# Patient Record
Sex: Male | Born: 1990 | Race: Black or African American | Hispanic: No | Marital: Single | Smoking: Current every day smoker
Health system: Southern US, Community
[De-identification: ages and names within clinical notes are randomized; demographics above are authoritative.]

---
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
## 2014-08-31 ENCOUNTER — Emergency Department (HOSPITAL_COMMUNITY): Payer: Self-pay

## 2014-08-31 ENCOUNTER — Emergency Department (HOSPITAL_COMMUNITY)
Admission: EM | Admit: 2014-08-31 | Discharge: 2014-08-31 | Disposition: A | Discharge status: Home / Self Care | Payer: Worker's Compensation | Attending: Emergency Medicine | Admitting: Emergency Medicine

## 2014-08-31 ENCOUNTER — Encounter (HOSPITAL_COMMUNITY): Payer: Self-pay | Admitting: *Deleted

## 2014-08-31 DIAGNOSIS — Y998 Other external cause status: Secondary | ICD-10-CM | POA: Insufficient documentation

## 2014-08-31 DIAGNOSIS — S6991XA Unspecified injury of right wrist, hand and finger(s), initial encounter: Secondary | ICD-10-CM | POA: Insufficient documentation

## 2014-08-31 DIAGNOSIS — Y929 Unspecified place or not applicable: Secondary | ICD-10-CM | POA: Insufficient documentation

## 2014-08-31 DIAGNOSIS — W230XXA Caught, crushed, jammed, or pinched between moving objects, initial encounter: Secondary | ICD-10-CM | POA: Insufficient documentation

## 2014-08-31 DIAGNOSIS — Z720 Tobacco use: Secondary | ICD-10-CM | POA: Insufficient documentation

## 2014-08-31 DIAGNOSIS — Y9389 Activity, other specified: Secondary | ICD-10-CM | POA: Insufficient documentation

## 2014-08-31 MED ORDER — TETANUS-DIPHTH-ACELL PERTUSSIS 5-2.5-18.5 LF-MCG/0.5 IM SUSP
0.5000 mL | Freq: Once | INTRAMUSCULAR | Status: AC
  Administered 2014-08-31: 0.5 mL via INTRAMUSCULAR
  Filled 2014-08-31: qty 0.5

## 2014-08-31 MED ORDER — HYDROCODONE-ACETAMINOPHEN 5-325 MG PO TABS: 1.0000 | ORAL_TABLET | Freq: Four times a day (QID) | ORAL | Status: AC | PRN

## 2014-08-31 NOTE — ED Provider Notes (Signed)
CSN: [id number]     Arrival date & time 08/31/14  2157 History  This chart was scribed for non-physician practitioner working with Rolland PorterMark James, MD by Murriel HopperAlec Clements, ED Scribe. This patient was seen in room TR07C/TR07C and the patient's care was started at 11:04 PM.    Chief Complaint  Patient presents with  . Finger Injury      The history is provided by the patient. No language interpreter was used.     HPI Comments: Earnest RosierJerome X Clements is a 24 y.o. male who presents to the Emergency Department complaining of constant right middle finger pain with associated swelling that has been present since immediately PTA. Pt states that he was changing the rims on a tire when his hand slipped and injured his finger. Pt states that it began to bleed from underneath his nail and notes that it is difficult to move.      History reviewed. No pertinent past medical history. History reviewed. No pertinent past surgical history. No family history on file. History  Substance Use Topics  . Smoking status: Current Every Day Smoker  . Smokeless tobacco: Not on file  . Alcohol Use: No    Review of Systems  Musculoskeletal: Positive for joint swelling and arthralgias.      Allergies  Review of patient's allergies indicates no known allergies.  Home Medications   Prior to Admission medications   Not on File   BP 139/84 mmHg  Pulse 55  Temp(Src) 98 F (36.7 C) (Oral)  Resp 14  SpO2 98% Physical Exam  Constitutional: He is oriented to person, place, and time. He appears well-developed and well-nourished.  HENT:  Head: Normocephalic and atraumatic.  Eyes: Conjunctivae and EOM are normal.  Neck: Normal range of motion.  Cardiovascular: Normal rate.   Pulmonary/Chest: Effort normal.  Abdominal: He exhibits no distension.  Musculoskeletal: Normal range of motion.  Right middle finger moderately swollen and tender to palpation, no bony abnormality or deformity, no laceration, range of motion  strength 5/5  Neurological: He is alert and oriented to person, place, and time.  Skin: Skin is warm and dry.  No subungual hematoma  Psychiatric: He has a normal mood and affect. His behavior is normal. Judgment and thought content normal.  Nursing note and vitals reviewed.   ED Course  Procedures (including critical care time)  DIAGNOSTIC STUDIES: Oxygen Saturation is 98% on RA, normal by my interpretation.    COORDINATION OF CARE: 11:08 PM Discussed treatment plan with pt at bedside and pt agreed to plan.   Labs Review Labs Reviewed - No data to display  Imaging Review Dg Finger Middle Right  08/31/2014   CLINICAL DATA:  Crushing injury to third distal phalanx  EXAM: RIGHT MIDDLE FINGER 2+V  COMPARISON:  None.  FINDINGS: There is no evidence of fracture or dislocation. There is no evidence of arthropathy or other focal bone abnormality. Soft tissues are unremarkable.  IMPRESSION: Negative.   Electronically Signed   By: Ellery Plunkaniel R Mitchell M.D.   On: 08/31/2014 23:06     EKG Interpretation None      MDM   Final diagnoses:  Finger injury, right, initial encounter   Patient with finger injury, after sustaining blunt trauma while removing a car will. Plain films are negative. Tetanus shot is updated. There is no laceration. Will treat pain, and splint the finger. Recommend primary care follow-up. Patient understands and agrees with the plan. He is stable and ready for discharge.  I personally  performed the services described in this documentation, which was scribed in my presence. The recorded information has been reviewed and is accurate.     Roxy Horseman, PA-C 08/31/14 2340  Vanetta Mulders, MD 09/01/14 [phone]

## 2014-08-31 NOTE — ED Notes (Signed)
The pt is c/o pain ib his  Rt middle finger.  His finger was caught on a tire and a rim while working today painful

## 2014-08-31 NOTE — Discharge Instructions (Signed)
Blunt Trauma You have been evaluated for injuries. You have been examined and your caregiver has not found injuries serious enough to require hospitalization. It is common to have multiple bruises and sore muscles following an accident. These tend to feel worse for the first 24 hours. You will feel more stiffness and soreness over the next several hours and worse when you wake up the first morning after your accident. After this point, you should begin to improve with each passing day. The amount of improvement depends on the amount of damage done in the accident. Following your accident, if some part of your body does not work as it should, or if the pain in any area continues to increase, you should return to the Emergency Department for re-evaluation.  HOME CARE INSTRUCTIONS  Routine care for sore areas should include:  Ice to sore areas every 2 hours for 20 minutes while awake for the next 2 days.  Drink extra fluids (not alcohol).  Take a hot or warm shower or bath once or twice a day to increase blood flow to sore muscles. This will help you "limber up".  Activity as tolerated. Lifting may aggravate neck or back pain.  Only take over-the-counter or prescription medicines for pain, discomfort, or fever as directed by your caregiver. Do not use aspirin. This may increase bruising or increase bleeding if there are small areas where this is happening. SEEK IMMEDIATE MEDICAL CARE IF:  Numbness, tingling, weakness, or problem with the use of your arms or legs.  A severe headache is not relieved with medications.  There is a change in bowel or bladder control.  Increasing pain in any areas of the body.  Short of breath or dizzy.  Nauseated, vomiting, or sweating.  Increasing belly (abdominal) discomfort.  Blood in urine, stool, or vomiting blood.  Pain in either shoulder in an area where a shoulder strap would be.  Feelings of lightheadedness or if you have a fainting  episode. Sometimes it is not possible to identify all injuries immediately after the trauma. It is important that you continue to monitor your condition after the emergency department visit. If you feel you are not improving, or improving more slowly than should be expected, call your physician. If you feel your symptoms (problems) are worsening, return to the Emergency Department immediately. Document Released: 02/25/2001 Document Revised: 08/24/2011 Document Reviewed: 01/18/2008 [facility] Patient Information 2015 [employer] [place]. This information is not intended to replace advice given to you by your health care provider. Make sure you discuss any questions you have with your health care provider.  Fingertip Injuries and Amputations Fingertip injuries are common and often get injured because they are last to escape when pulling your hand out of harm's way. You have amputated (cut off) part of your finger. How this turns out depends largely on how much was amputated. If just the tip is amputated, often the end of the finger will grow back and the finger may return to much the same as it was before the injury.  If more of the finger is missing, your caregiver has done the best with the tissue remaining to allow you to keep as much finger as is possible. Your caregiver after checking your injury has tried to leave you with a painless fingertip that has durable, feeling skin. If possible, your caregiver has tried to maintain the finger's length and appearance and preserve its fingernail.  Please read the instructions outlined below and refer to this sheet in the  next few weeks. These instructions provide you with general information on caring for yourself. Your caregiver may also give you specific instructions. While your treatment has been done according to the most current medical practices available, unavoidable complications occasionally occur. If you have any problems or questions after discharge, please  call your caregiver. HOME CARE INSTRUCTIONS   You may resume normal diet and activities as directed or allowed.  Keep your hand elevated above the level of your heart. This helps decrease pain and swelling.  Keep ice packs (or a bag of ice wrapped in a towel) on the injured area for 15-20 minutes, 03-04 times per day, for the first two days.  Change dressings if necessary or as directed.  Clean the wound daily or as directed.  Only take over-the-counter or prescription medicines for pain, discomfort, or fever as directed by your caregiver.  Keep appointments as directed. SEEK IMMEDIATE MEDICAL CARE IF:  You develop redness, swelling, numbness or increasing pain in the wound.  There is pus coming from the wound.  You develop an unexplained oral temperature above 102 F (38.9 C) or as your caregiver suggests.  There is a foul (bad) smell coming from the wound or dressing.  There is a breaking open of the wound (edges not staying together) after sutures or staples have been removed. MAKE SURE YOU:   Understand these instructions.  Will watch your condition.  Will get help right away if you are not doing well or get worse. Document Released: 04/22/2005 Document Revised: 08/24/2011 Document Reviewed: 03/21/2008 [facility] Patient Information 2015 [place], [place]. This information is not intended to replace advice given to you by your health care provider. Make sure you discuss any questions you have with your health care provider.

## 2015-03-19 ENCOUNTER — Encounter (HOSPITAL_COMMUNITY): Payer: Self-pay | Admitting: Emergency Medicine

## 2015-03-19 ENCOUNTER — Emergency Department (HOSPITAL_COMMUNITY)
Admission: EM | Admit: 2015-03-19 | Discharge: 2015-03-19 | Disposition: A | Discharge status: Home / Self Care | Payer: Self-pay | Attending: Emergency Medicine | Admitting: Emergency Medicine

## 2015-03-19 DIAGNOSIS — Y998 Other external cause status: Secondary | ICD-10-CM | POA: Insufficient documentation

## 2015-03-19 DIAGNOSIS — Y9389 Activity, other specified: Secondary | ICD-10-CM | POA: Insufficient documentation

## 2015-03-19 DIAGNOSIS — S0502XA Injury of conjunctiva and corneal abrasion without foreign body, left eye, initial encounter: Secondary | ICD-10-CM | POA: Insufficient documentation

## 2015-03-19 DIAGNOSIS — Y9289 Other specified places as the place of occurrence of the external cause: Secondary | ICD-10-CM | POA: Insufficient documentation

## 2015-03-19 DIAGNOSIS — H5712 Ocular pain, left eye: Secondary | ICD-10-CM

## 2015-03-19 DIAGNOSIS — Z720 Tobacco use: Secondary | ICD-10-CM | POA: Insufficient documentation

## 2015-03-19 MED ORDER — TETRACAINE HCL 0.5 % OP SOLN
1.0000 [drp] | Freq: Once | OPHTHALMIC | Status: AC
  Administered 2015-03-19: 1 [drp] via OPHTHALMIC
  Filled 2015-03-19 (×2): qty 2

## 2015-03-19 MED ORDER — IBUPROFEN 800 MG PO TABS: 800.0000 mg | ORAL_TABLET | Freq: Three times a day (TID) | ORAL | Status: AC

## 2015-03-19 MED ORDER — FLUORESCEIN SODIUM 1 MG OP STRP
1.0000 | ORAL_STRIP | Freq: Once | OPHTHALMIC | Status: AC
  Administered 2015-03-19: 1 via OPHTHALMIC
  Filled 2015-03-19: qty 1

## 2015-03-19 MED ORDER — POLYMYXIN B-TRIMETHOPRIM 10000-0.1 UNIT/ML-% OP SOLN: 1.0000 [drp] | OPHTHALMIC | Status: AC

## 2015-03-19 NOTE — Discharge Instructions (Signed)
1. Medications: polytrim eye drops, ibuprofen, usual home medications 2. Treatment: rest, drink plenty of fluids  3. Follow Up: please followup with ophthalmology in 2-3 days and your primary doctor in the next week for discussion of your diagnoses and further evaluation after today's visit; if you do not have a primary care doctor use the resource guide provided to find one; please return to the ER for vision changes, severe pain, new or worsening symptoms    Corneal Abrasion The cornea is the clear covering at the front and center of the eye. When looking at the colored portion of the eye (iris), you are looking through the cornea. This very thin tissue is made up of many layers. The surface layer is a single layer of cells (corneal epithelium) and is one of the most sensitive tissues in the body. If a scratch or injury causes the corneal epithelium to come off, it is called a corneal abrasion. If the injury extends to the tissues below the epithelium, the condition is called a corneal ulcer. CAUSES   Scratches.  Trauma.  Foreign body in the eye. Some people have recurrences of abrasions in the area of the original injury even after it has healed (recurrent erosion syndrome). Recurrent erosion syndrome generally improves and goes away with time. SYMPTOMS   Eye pain.  Difficulty or inability to keep the injured eye open.  The eye becomes very sensitive to light.  Recurrent erosions tend to happen suddenly, first thing in the morning, usually after waking up and opening the eye. DIAGNOSIS  Your health care provider can diagnose a corneal abrasion during an eye exam. Dye is usually placed in the eye using a drop or a small paper strip moistened by your tears. When the eye is examined with a special light, the abrasion shows up clearly because of the dye. TREATMENT   Small abrasions may be treated with antibiotic drops or ointment alone.  A pressure patch may be put over the eye. If this  is done, follow your doctor's instructions for when to remove the patch. Do not drive or use machines while the eye patch is on. Judging distances is hard to do with a patch on. If the abrasion becomes infected and spreads to the deeper tissues of the cornea, a corneal ulcer can result. This is serious because it can cause corneal scarring. Corneal scars interfere with light passing through the cornea and cause a loss of vision in the involved eye. HOME CARE INSTRUCTIONS  Use medicine or ointment as directed. Only take over-the-counter or prescription medicines for pain, discomfort, or fever as directed by your health care provider.  Do not drive or operate machinery if your eye is patched. Your ability to judge distances is impaired.  If your health care provider has given you a follow-up appointment, it is very important to keep that appointment. Not keeping the appointment could result in a severe eye infection or permanent loss of vision. If there is any problem keeping the appointment, let your health care provider know. SEEK MEDICAL CARE IF:   You have pain, light sensitivity, and a scratchy feeling in one eye or both eyes.  Your pressure patch keeps loosening up, and you can blink your eye under the patch after treatment.  Any kind of discharge develops from the eye after treatment or if the lids stick together in the morning.  You have the same symptoms in the morning as you did with the original abrasion days, weeks, or  months after the abrasion healed. MAKE SURE YOU:   Understand these instructions.  Will watch your condition.  Will get help right away if you are not doing well or get worse. Document Released: 05/29/2000 Document Revised: 06/06/2013 Document Reviewed: 02/06/2013 [facility] Patient Information 2015 [place], [place]. This information is not intended to replace advice given to you by your health care provider. Make sure you discuss any questions you have with your  health care provider.   Emergency Department Resource Guide 1) Find a Doctor and Pay Out of Pocket Although you won't have to find out who is covered by your insurance plan, it is a good idea to ask around and get recommendations. You will then need to call the office and see if the doctor you have chosen will accept you as a new patient and what types of options they offer for patients who are self-pay. Some doctors offer discounts or will set up payment plans for their patients who do not have insurance, but you will need to ask so you aren't surprised when you get to your appointment.  2) Contact Your Local Health Department Not all health departments have doctors that can see patients for sick visits, but many do, so it is worth a call to see if yours does. If you don't know where your local health department is, you can check in your phone book. The CDC also has a tool to help you locate your state's health department, and many state websites also have listings of all of their local health departments.  3) Find a Walk-in Clinic If your illness is not likely to be very severe or complicated, you may want to try a walk in clinic. These are popping up all over the country in pharmacies, drugstores, and shopping centers. They're usually staffed by nurse practitioners or physician assistants that have been trained to treat common illnesses and complaints. They're usually fairly quick and inexpensive. However, if you have serious medical issues or chronic medical problems, these are probably not your best option.  No Primary Care Doctor: - Call Health Connect at  [phone] - they can help you locate a primary care doctor that  accepts your insurance, provides certain services, etc. - Physician Referral Service- [phone]  Chronic Pain Problems: Organization         Address  Phone   Notes  [facility]  [phone] Patients need to be referred by their primary care doctor.    Medication Assistance: Organization         Address  Phone   Notes  [facility] Medication [facility] [address] [place] [place], [place] [address] [phone] --Must be a resident of [facility] -- Must have NO insurance coverage whatsoever (no Medicaid/ Medicare, etc.) -- The pt. MUST have a primary care doctor that directs their care regularly and follows them in the community   [facility]  [phone]   [employer]  [phone]    Agencies that provide inexpensive medical care: Organization         Address  Phone   Notes  [facility]  [phone]   [facility]    [phone]   [facility] [address] [place], [place] [address] [phone]   [facility] [address] [place] [place] [phone]   Planned Parenthood    [phone]   [facility]    (  [phone]   [facility]  [address], [place] Phone:  [phone], Fax:  [phone] Hours of Operation:  9 am - 6 pm, M-F.  Also accepts Medicaid/Medicare and self-pay.  [facility]  [address], [place] Phone: [phone], Fax: [phone]. Hours of Operation:  8:30 am - 5:30 pm, M-F.  Also accepts Medicaid and self-pay.  [facility] [place] [address] [place] Phone: [phone]   [facility] [address] Natasha Bence [place], [place] [phone], Ext. [phone] Mondays & Thursdays: 7-9 AM.  First 15 patients are seen on a first come, first serve basis.    Medicaid-accepting [facility] Providers:  Organization         Address  Phone   Notes  [facility] [address], [place] [phone] Also accepts self-pay patients.  [facility] [address] Laurell Josephs [place], [place]  [phone]   [facility] [address], [place] [phone]   [facility] [address], [place] [phone]   Renaye Rakers [address], [place]   [phone] Only accepts [place] patients after they have their name applied to their card.   Self-Pay (no insurance) in [facility]:  Organization         Address  Phone   Notes  Sickle Cell Patients, [facility] [address] [place], [place] [phone]   [facility] [address] [place], [place] [phone]   [facility]  [address], [place] [phone]   [facility]/Dr. Osei-Bonsu  [address] [place] or [address], [place] [phone] Phone number for both [place] and [place] locations is the same.  [facility] [address] [place] [phone]   [facility] [address] [place] or [address] [phone]   [facility] [address] [place] [phone], phone; [phone], fax Sees patients 1st and 3rd Saturday of every month.  Must not qualify for public or private insurance (i.e. Medicaid, Medicare, [place] Health Choice, Veterans' Benefits)  Household income should be no more than 200% of the poverty level The clinic cannot treat you if you are pregnant or think you are pregnant  Sexually transmitted diseases are not treated at the clinic.    Dental Care: Organization         Address  Phone  Notes  [facility] Department of [facility] [address] [place], [place] [phone] Accepts children up to age 67 who are enrolled in [place] or [place] Health Choice; pregnant women with a Medicaid card; and children who have applied for Medicaid or [place] Health Choice, but were declined, whose parents can pay a reduced fee at time of service.    [facility] Department of [facility]  [address], [place] [phone] Accepts children up to age 14 who are enrolled in [place] or [place] Health Choice; pregnant women with a Medicaid card; and children who have applied for Medicaid or [place] Health Choice, but were declined, whose parents can pay a reduced fee at time of service.  [facility]  [address] [place], [place] [phone] Patients are seen by appointment only. Walk-ins are not accepted. [facility] will see patients 43 years of age and older. Monday - Tuesday (8am-5pm) Most Wednesdays (8:30-5pm) $30 per visit, cash only  [facility]  [address], [facility] [phone] Patients are seen by appointment only. Walk-ins are not accepted. [facility] will see patients 61 years of age and older. One Wednesday Evening (Monthly: Volunteer Based).  $30 per visit, cash only  [employer]  [phone] for adults; Children under age 64, call Graduate Pediatric Dentistry at [phone]. Children aged 44-14, please call [phone] to request a pediatric application.  Dental services are provided in all areas of dental care including fillings, crowns and bridges, complete and partial dentures, implants, gum treatment, root canals, and extractions. Preventive care is also provided. Treatment is provided to both adults and children. Patients are selected via a lottery and there is often a waiting list.   [facility] [address] [place]  [phone] [email or web address]   [facility] [address] [place], [place] [phone], Ext. [phone] Second and Fourth Thursday of each month, opens at 6:30 AM; Clinic ends at 9 AM.  Patients are seen on a first-come first-served basis, and a limited number are seen during each clinic.   [facility]  [address] Ether Griffins [place], [place] [phone]   Eligibility Requirements You must have lived in [place], [place], or [place] counties for at least the last three months.   You cannot be eligible for state or federal sponsored [place], including [employer], [place], or [employer].   You generally cannot be eligible for healthcare insurance through your employer.    How to apply: Eligibility screenings are held every Tuesday and Wednesday afternoon from 1:00 pm until 4:00 pm. You do not need an appointment for the interview!  [facility] [address] [place], [place] [phone]   [facility]  [phone]   [facility] Health Department  [phone]   [facility] Health Department  [phone]    Behavioral Health Resources in the Community: Intensive Outpatient Programs Organization         Address  Phone  Notes  [facility] [address], [place], [place] [phone]   [facility] [address] [place], [place] [phone]   [facility] [address] [place], [place]  [phone]   [facility] [address]  [place], [place] [phone] or [phone]   Substance Abuse Resources Organization         Address  Phone  Notes  [facility] Drug [facility]  [phone]   [facility]  [phone]   [facility]  [phone]   Floydene Flock  [phone]   Residential & Outpatient Substance Abuse Program  [phone]   Psychological Services Organization         Address  Phone  Notes  [facility] Behavioral Health  [phone]   [facility]  [phone]   [facility] [address] [place] [phone] or [phone]    Mobile Crisis Teams Organization         Address  Phone  Notes  [facility], Mobile Crisis Care Unit  [phone]   [facility]  [address]  [place], [place] [phone]   Doristine LocksSharon DeEsch [address] [place] [phone]    Self-Help/Support Groups Organization         Address  Phone             Notes  [facility] [place] - variety of support groups  336- [phone] Call for more information  Narcotics Anonymous (NA), [facility] [address], [place]  2 meetings at this location   Statisticianesidential Treatment Programs Organization         Address  Phone  Notes  [facility] [address],    [place]  [phone]   [facility]  [address], [place] [phone], [place], [place] [phone]   [facility] [address], [place] [phone] Admissions: 8am-3pm M-F  [facility] [address]    [place], [place] [phone]   [facility] [address], [place], [place] [phone]   [facility] [address]  [place], [place] [phone]   [facility] - Intensive Outpatient [address], [place], [place] [phone]   [facility] ([facility] [address]  [place], [place] [phone] or [phone]   [facility] ([facility]) [address] [place], [place] [phone] Accepts Medicaid  [facility] [address]  [place] [phone] Substance Abuse/Addiction Treatment   [facility] Behavioral Health Resources Organization         Address  Phone  Notes  [facility]  [phone]) [phone]   Angie FavaJulie Brannon, PhD [address] [place], [place]   [phone]) [phone] or [phone](336) [phone]   [facility]   [address] [place], [place] [phone]) [phone]   [facility] [address], [place], [place] [phone]) [phone] Insurance/Medicaid/sponsorship through [facility]  [facility] [address]                                    [place], [place] [phone]) [phone]  Therapy/tele-psych/case  [facility] [address]   [place], [place] [phone]    Dr. Lolly MustacheArfeen  [phone]   [facility] [place]  [employer] [facility] [place] [facility] 1) [address], [place] 2) [address], [place] 3)  [address], [place] [phone]) [phone]) [phone]  [phone]) [phone]   [facility] Child Abuse Hotline [phone](336) [phone] or [phone](336) [phone] (After Hours)
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
## 2020-01-23 ENCOUNTER — Emergency Department (HOSPITAL_COMMUNITY)
Admission: EM | Admit: 2020-01-23 | Discharge: 2020-01-23 | Disposition: A | Discharge status: Home / Self Care | Payer: Self-pay | Attending: Emergency Medicine | Admitting: Emergency Medicine

## 2020-01-23 ENCOUNTER — Other Ambulatory Visit: Payer: Self-pay

## 2020-01-23 ENCOUNTER — Emergency Department (HOSPITAL_COMMUNITY): Payer: Self-pay

## 2020-01-23 ENCOUNTER — Encounter (HOSPITAL_COMMUNITY): Payer: Self-pay | Admitting: Emergency Medicine

## 2020-01-23 DIAGNOSIS — W312XXA Contact with powered woodworking and forming machines, initial encounter: Secondary | ICD-10-CM | POA: Insufficient documentation

## 2020-01-23 DIAGNOSIS — Z23 Encounter for immunization: Secondary | ICD-10-CM | POA: Insufficient documentation

## 2020-01-23 DIAGNOSIS — Y999 Unspecified external cause status: Secondary | ICD-10-CM | POA: Insufficient documentation

## 2020-01-23 DIAGNOSIS — S61219A Laceration without foreign body of unspecified finger without damage to nail, initial encounter: Secondary | ICD-10-CM

## 2020-01-23 DIAGNOSIS — S61212A Laceration without foreign body of right middle finger without damage to nail, initial encounter: Secondary | ICD-10-CM | POA: Insufficient documentation

## 2020-01-23 DIAGNOSIS — Y9389 Activity, other specified: Secondary | ICD-10-CM | POA: Insufficient documentation

## 2020-01-23 DIAGNOSIS — F17200 Nicotine dependence, unspecified, uncomplicated: Secondary | ICD-10-CM | POA: Insufficient documentation

## 2020-01-23 DIAGNOSIS — Y929 Unspecified place or not applicable: Secondary | ICD-10-CM | POA: Insufficient documentation

## 2020-01-23 DIAGNOSIS — S61210A Laceration without foreign body of right index finger without damage to nail, initial encounter: Secondary | ICD-10-CM | POA: Insufficient documentation

## 2020-01-23 MED ORDER — TETANUS-DIPHTH-ACELL PERTUSSIS 5-2.5-18.5 LF-MCG/0.5 IM SUSP
0.5000 mL | Freq: Once | INTRAMUSCULAR | Status: AC
  Administered 2020-01-23: 0.5 mL via INTRAMUSCULAR
  Filled 2020-01-23: qty 0.5

## 2020-01-23 MED ORDER — LIDOCAINE HCL (PF) 1 % IJ SOLN
5.0000 mL | Freq: Once | INTRAMUSCULAR | Status: AC
  Administered 2020-01-23: 5 mL
  Filled 2020-01-23: qty 30

## 2020-01-23 MED ORDER — KETOROLAC TROMETHAMINE 60 MG/2ML IM SOLN
60.0000 mg | Freq: Once | INTRAMUSCULAR | Status: AC
  Administered 2020-01-23: 60 mg via INTRAMUSCULAR
  Filled 2020-01-23: qty 2

## 2020-01-23 NOTE — ED Provider Notes (Signed)
[facility]-EMERGENCY DEPT Provider Note   CSN: [id number] Arrival date & time: 01/23/20  1726     History Chief Complaint  Patient presents with  . Finger Injury    Kurt Clements is a 29 y.o. male.  HPI 29 year old male with no significant medical history presents to the ER with a laceration to his right hand.  Patient states he was working with a wood saw when his hand slipped and went under the side.  He presented with lacerations to his index, middle, and ring finger on his right hand.  Denies any numbness or tingling.  Endorses mild to moderate pain.  Patient was able to make the bleeding stopped in the waiting room.  Unaware of last tetanus      History reviewed. No pertinent past medical history.  There are no problems to display for this patient.   History reviewed. No pertinent surgical history.     No family history on file.  Social History   Tobacco Use  . Smoking status: Current Every Day Smoker  Substance Use Topics  . Alcohol use: No  . Drug use: Not on file    Home Medications Prior to Admission medications   Medication Sig Start Date End Date Taking? Authorizing Provider  HYDROcodone-acetaminophen (NORCO/VICODIN) 5-325 MG per tablet Take 1-2 tablets by mouth every 6 (six) hours as needed. 08/31/14   Roxy Horseman, PA-C  ibuprofen (ADVIL,MOTRIN) 800 MG tablet Take 1 tablet (800 mg total) by mouth 3 (three) times daily. 03/19/15   Mady Gemma, PA-C  trimethoprim-polymyxin b (POLYTRIM) ophthalmic solution Place 1 drop into the left eye every 4 (four) hours. 03/19/15   Mady Gemma, PA-C    Allergies    Patient has no known allergies.  Review of Systems   Review of Systems  Constitutional: Negative for chills and fever.  Skin: Positive for wound.  Neurological: Negative for weakness and numbness.    Physical Exam Updated Vital Signs BP (!) 187/96   Pulse 82   Temp 98.9 F (37.2 C)   Resp 16   Ht 6'  7" (2.007 m)   Wt 108.9 kg   SpO2 98%   BMI 27.04 kg/m   Physical Exam Vitals reviewed.  Constitutional:      General: He is not in acute distress.    Appearance: Normal appearance. He is not ill-appearing or diaphoretic.  HENT:     Head: Normocephalic and atraumatic.  Eyes:     General:        Right eye: No discharge.        Left eye: No discharge.     Extraocular Movements: Extraocular movements intact.     Conjunctiva/sclera: Conjunctivae normal.  Musculoskeletal:        General: Signs of injury present. No swelling. Normal range of motion.     Comments: 1 cm x 1 mm lacerations to the right index, middle and ring finger. Middle finger with evidence of missing soft tissue and partial nail damage. Not actively bleeding. 5/5 strength in each finger, including grip strength. Sensations intact. 2+ radial pulse. Able to move all 4 fingers without difficulty.   Skin:    Findings: Lesion present. No erythema or rash.  Neurological:     General: No focal deficit present.     Mental Status: He is alert and oriented to person, place, and time.     Sensory: No sensory deficit.     Motor: No weakness.  Psychiatric:  Mood and Affect: Mood normal.        Behavior: Behavior normal.         ED Results / Procedures / Treatments   Labs (all labs ordered are listed, but only abnormal results are displayed) Labs Reviewed - No data to display  EKG None  Radiology DG Hand Complete Right  Result Date: 01/23/2020 CLINICAL DATA:  Patient got his right index and middle fingers caught in a wood saw. Bleeding and open areas noted to both fingers. Able to move fingers, good capillary refill. EXAM: RIGHT HAND - COMPLETE 3+ VIEW COMPARISON:  None. FINDINGS: No fracture.  No bone lesion. Joints normally spaced and aligned. Soft tissue injury noted to tips the index and middle fingers and more subtly to the tip of the fourth finger. Small amount of avulse soft tissue from the tip of the  middle finger. No radiopaque foreign body. IMPRESSION: 1. No fracture or dislocation.  No radiopaque foreign body. 2. Soft tissue injuries to the finger tips of the index middle and ring fingers. Electronically Signed   By: Amie Portland M.D.   On: 01/23/2020 18:40    Procedures .Marland KitchenLaceration Repair  Date/Time: 01/23/2020 8:40 PM Performed by: Mare Ferrari, PA-C Authorized by: Mare Ferrari, PA-C   Consent:    Consent obtained:  Verbal   Consent given by:  Patient   Risks discussed:  Infection, need for additional repair, pain, poor cosmetic result and poor wound healing   Alternatives discussed:  No treatment and delayed treatment Universal protocol:    Procedure explained and questions answered to patient or proxy's satisfaction: yes     Relevant documents present and verified: yes     Test results available and properly labeled: yes     Imaging studies available: yes     Required blood products, implants, devices, and special equipment available: yes     Site/side marked: yes     Immediately prior to procedure, a time out was called: yes     Patient identity confirmed:  Verbally with patient Anesthesia (see MAR for exact dosages):    Anesthesia method:  Local infiltration   Local anesthetic:  Lidocaine 1% w/o epi Laceration details:    Location:  Finger   Finger location:  R index finger   Length (cm):  1   Depth (mm):  1 Repair type:    Repair type:  Simple Pre-procedure details:    Preparation:  Imaging obtained to evaluate for foreign bodies and patient was prepped and draped in usual sterile fashion Exploration:    Hemostasis achieved with:  Direct pressure   Wound exploration: wound explored through full range of motion and entire depth of wound probed and visualized     Wound extent: no foreign bodies/material noted, no muscle damage noted, no nerve damage noted, no tendon damage noted and no underlying fracture noted   Treatment:    Area cleansed with:  Betadine  and saline   Amount of cleaning:  Extensive   Irrigation solution:  Sterile saline   Irrigation volume:  50cc   Visualized foreign bodies/material removed: no   Skin repair:    Repair method:  Sutures   Suture material:  Prolene   Suture technique:  Simple interrupted   Number of sutures:  2 Approximation:    Approximation:  Loose Post-procedure details:    Dressing:  Non-adherent dressing   Patient tolerance of procedure:  Tolerated well, no immediate complications   (including critical care time)  Medications Ordered in ED Medications  lidocaine (PF) (XYLOCAINE) 1 % injection 5 mL (has no administration in time range)  ketorolac (TORADOL) injection 60 mg (60 mg Intramuscular Given 01/23/20 1853)  Tdap (BOOSTRIX) injection 0.5 mL (0.5 mLs Intramuscular Given 01/23/20 1853)    ED Course  I have reviewed the triage vital signs and the nursing notes.  Pertinent labs & imaging results that were available during my care of the patient were reviewed by me and considered in my medical decision making (see chart for details).    MDM Rules/Calculators/A&P                          29 year old male with finger lacerations from a one-time on the right Pressure irrigation performed. Wound explored and base of wound visualized in a bloodless field without evidence of foreign body.  Laceration occurred < 8 hours prior to repair which was well tolerated.  Tdap updated.  Plain films without evidence of underlying fracture.  Pt has no comorbidities to effect normal wound healing. Pt discharged  without antibiotics.  Discussed suture home care with patient and answered questions. Pt to follow-up for wound check and suture removal in 10 days; they are to return to the ED sooner for signs of infection.  Patient is uninsured, added information for [facility], hand surgery, and also encouraged to come to the ED for any new or worsening signs of infection.  Wounds irrigated copiously,  performed 2 sutures to the right index finger.  Other wounds had no indication for suturing as they were not well approximated.  Encouraged to take Tylenol for pain. Pt is hemodynamically stable with no complaints prior to dc.    Final Clinical Impression(s) / ED Diagnoses Final diagnoses:  Laceration of finger of right hand without foreign body, nail damage status unspecified, unspecified finger, initial encounter    Rx / DC Orders ED Discharge Orders    None       Leone Brand 01/23/20 2042    Tilden Fossa, MD 01/23/20 [phone]

## 2020-01-23 NOTE — ED Triage Notes (Signed)
Patient got his right index and middle fingers caught in a wood saw. Bleeding and open areas noted to both fingers. Able to move fingers, good capillary refill.

## 2020-01-23 NOTE — Discharge Instructions (Addendum)
Please make sure to keep the area clean and dry with soap, water, you may buy over-the-counter alcohol or Betadine as well.  You will need to have the sutures in your fingers removed in 10-14 days, please seek medical help for this. Please keep your fingers wrapped, change the gauze daily. Keep them wrapped during the day when you are active, you may keep them unwrapped at night when you are sleeping.  You may buy gauze at your local pharmacy.  It may take quite some time for your fingers to heal, so please make sure to take very good care of them.  I have provided the contact information for [facility] which is a frequent leg in the area, as well as Dr. Merlyn Lot who is the hand surgeon here in the area.  Please follow-up with them in a week or so.  If you have any new or worsening symptoms, or any signs of infection which include worsening redness, swelling, pain, discharge, fevers, chills please make sure to return to the ER immediately.

## 2020-01-24 ENCOUNTER — Emergency Department (HOSPITAL_COMMUNITY)
Admission: EM | Admit: 2020-01-24 | Discharge: 2020-01-24 | Disposition: A | Discharge status: Other Acute Inpatient Hospital | Payer: Self-pay

## 2020-08-08 ENCOUNTER — Encounter (HOSPITAL_COMMUNITY): Payer: Self-pay | Admitting: Emergency Medicine

## 2020-08-08 ENCOUNTER — Emergency Department (HOSPITAL_COMMUNITY)
Admission: EM | Admit: 2020-08-08 | Discharge: 2020-08-08 | Disposition: A | Discharge status: Home / Self Care | Payer: Self-pay | Attending: Emergency Medicine | Admitting: Emergency Medicine

## 2020-08-08 DIAGNOSIS — H9201 Otalgia, right ear: Secondary | ICD-10-CM | POA: Insufficient documentation

## 2020-08-08 DIAGNOSIS — F17200 Nicotine dependence, unspecified, uncomplicated: Secondary | ICD-10-CM | POA: Insufficient documentation

## 2020-08-08 DIAGNOSIS — H6123 Impacted cerumen, bilateral: Secondary | ICD-10-CM | POA: Insufficient documentation

## 2020-08-08 NOTE — ED Notes (Signed)
Pt d/c home per MD order. Discharge summary reviewed with pt. Pt verbalizes understanding. Ambulatory off unit. No s/s of acute distress noted.  

## 2020-08-08 NOTE — ED Provider Notes (Signed)
[facility] EMERGENCY DEPARTMENT Provider Note   CSN: [id number] Arrival date & time: 08/08/20  4259     History No chief complaint on file.   Kurt Clements is a 30 y.o. male.   Otalgia Location:  Right Behind ear:  No abnormality Quality:  Aching Severity:  Moderate Onset quality:  Gradual Duration:  4 days Timing:  Constant Progression:  Worsening Chronicity:  New Relieved by:  Nothing Worsened by:  Nothing Ineffective treatments:  OTC medications Associated symptoms: no congestion, no cough, no ear discharge, no fever, no headaches, no hearing loss, no neck pain, no rhinorrhea, no sore throat and no tinnitus        History reviewed. No pertinent past medical history.  There are no problems to display for this patient.   History reviewed. No pertinent surgical history.     No family history on file.  Social History   Tobacco Use  . Smoking status: Current Every Day Smoker  Substance Use Topics  . Alcohol use: No    Home Medications Prior to Admission medications   Medication Sig Start Date End Date Taking? Authorizing Provider  HYDROcodone-acetaminophen (NORCO/VICODIN) 5-325 MG per tablet Take 1-2 tablets by mouth every 6 (six) hours as needed. 08/31/14   Roxy Horseman, PA-C  ibuprofen (ADVIL,MOTRIN) 800 MG tablet Take 1 tablet (800 mg total) by mouth 3 (three) times daily. 03/19/15   Mady Gemma, PA-C  trimethoprim-polymyxin b (POLYTRIM) ophthalmic solution Place 1 drop into the left eye every 4 (four) hours. 03/19/15   Mady Gemma, PA-C    Allergies    Patient has no known allergies.  Review of Systems   Review of Systems  Constitutional: Negative for fever.  HENT: Positive for ear pain. Negative for congestion, ear discharge, hearing loss, rhinorrhea, sore throat and tinnitus.   Respiratory: Negative for cough.   Musculoskeletal: Negative for neck pain.  Neurological: Negative for headaches.     Physical Exam Updated Vital Signs BP (!) 132/94 (BP Location: Right Arm)   Pulse 70   Temp 98.2 F (36.8 C) (Oral)   Resp 13   SpO2 98%   Physical Exam Vitals and nursing note reviewed.  Constitutional:      General: He is not in acute distress.    Appearance: Normal appearance.  HENT:     Head: Normocephalic and atraumatic.     Right Ear: There is impacted cerumen.     Left Ear: There is impacted cerumen.     Ears:     Comments: No pain with manipulation of the pinna, no mastoid tenderness, no proptosis, no erythema, no drainage     Nose: No rhinorrhea.  Eyes:     General:        Right eye: No discharge.        Left eye: No discharge.     Conjunctiva/sclera: Conjunctivae normal.  Cardiovascular:     Rate and Rhythm: Normal rate and regular rhythm.  Pulmonary:     Effort: Pulmonary effort is normal.     Breath sounds: No stridor.  Abdominal:     General: Abdomen is flat. There is no distension.     Palpations: Abdomen is soft.  Musculoskeletal:        General: No deformity or signs of injury.  Skin:    General: Skin is warm and dry.  Neurological:     General: No focal deficit present.     Mental Status: He is alert. Mental  status is at baseline.     Motor: No weakness.  Psychiatric:        Mood and Affect: Mood normal.        Behavior: Behavior normal.        Thought Content: Thought content normal.     ED Results / Procedures / Treatments   Labs (all labs ordered are listed, but only abnormal results are displayed) Labs Reviewed - No data to display  EKG None  Radiology No results found.  Procedures Procedures   Medications Ordered in ED Medications - No data to display  ED Course  I have reviewed the triage vital signs and the nursing notes.  Pertinent labs & imaging results that were available during my care of the patient were reviewed by me and considered in my medical decision making (see chart for details).    MDM  Rules/Calculators/A&P                          Patient has right otalgia.  No signs of deep space infection overall well-appearing.  Has bilateral significant cerumen impactions.  Is given instructions on how to clean cerumen impactions with Debrox drops and told to avoid Q-tips, he is one to commonly use Q-tips.  Is given ENT follow-up if symptoms do not resolve with ear cleaning. Final Clinical Impression(s) / ED Diagnoses Final diagnoses:  Right ear pain    Rx / DC Orders ED Discharge Orders    None       Sabino Donovan, MD 08/08/20 [phone]

## 2020-08-08 NOTE — ED Triage Notes (Signed)
Pt here with c/o right ear pain that started a couple days ago,

## 2020-08-08 NOTE — Discharge Instructions (Addendum)
You can try over-the-counter Debrox eardrops for cleaning.  Follow the instructions on the box.  You can do this twice a day for the first week to clear out the large impaction.  If you still have pain despite cleaning with Debrox follow-up with the ENT group listed below.  [facility]  Located in: [facility] Address: [address] [place], [place], [place] [address] Phone: [phone]

## 2021-12-24 IMAGING — CR DG HAND COMPLETE 3+V*R*
3 series · 3 of 3 positions shown · non-contrast
Comparison: None.

CLINICAL DATA: Patient got his right index and middle fingers
caught in [employer] saw. Bleeding and open areas noted to both fingers.
Able to move fingers, good capillary refill.

EXAM:
RIGHT HAND - COMPLETE 3+ VIEW

[x hand pa right]
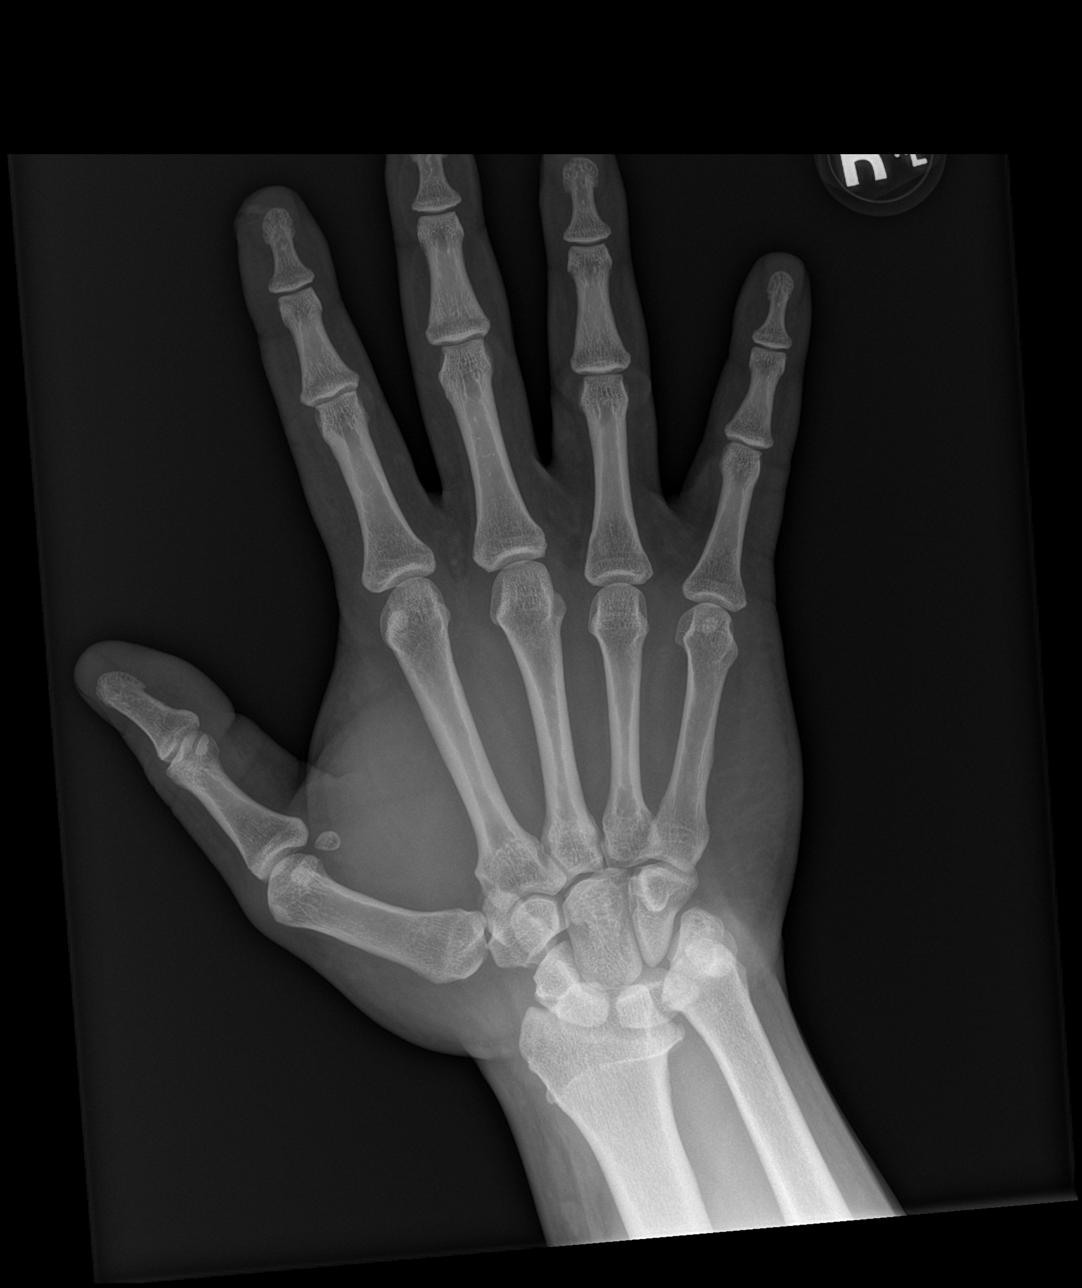

[x hand obl right]
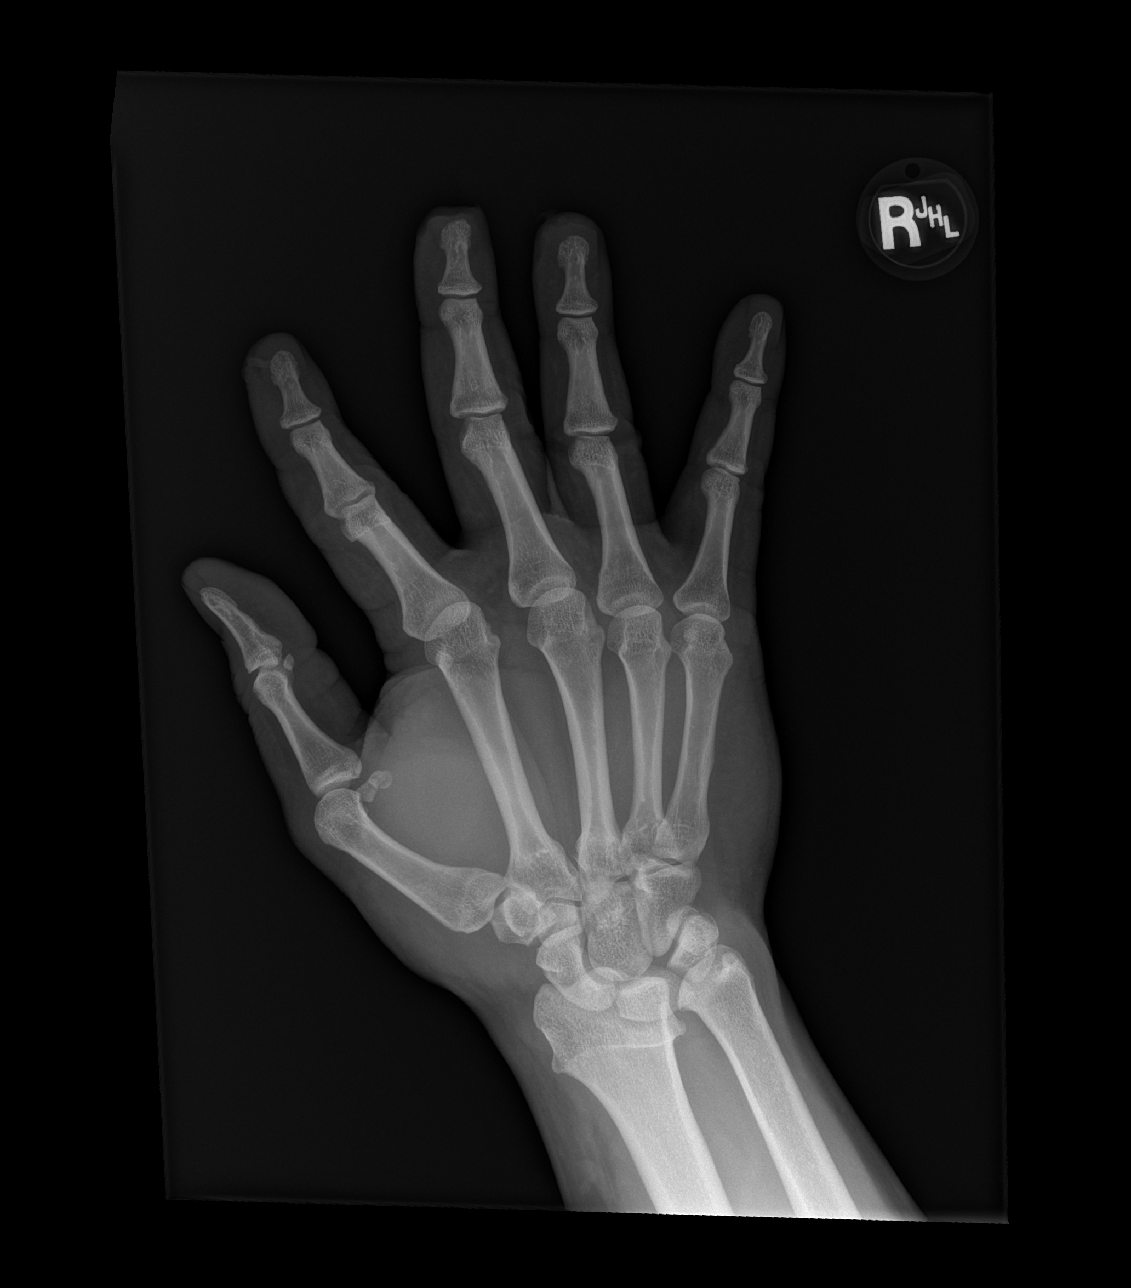

[x hand lat right]
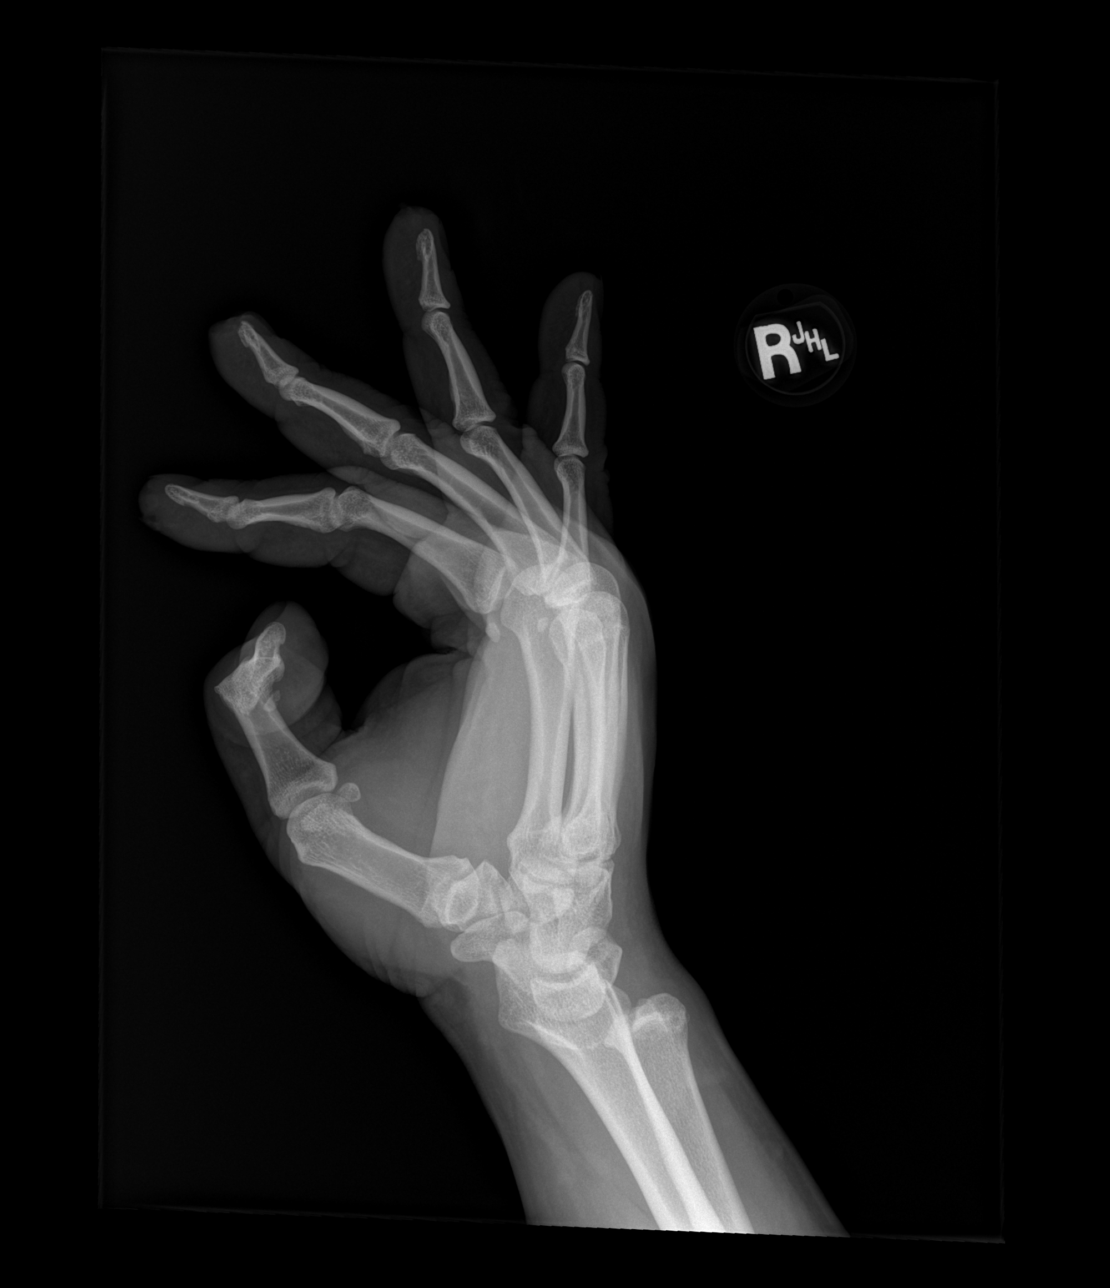

[3 of 3 positions shown; findings below may reference images not displayed]

FINDINGS: No fracture.  No bone lesion.

Joints normally spaced and aligned.

Soft tissue injury noted to tips the index and middle fingers and
more subtly to the tip of the fourth finger. Small amount of avulse
soft tissue from the tip of the middle finger. No radiopaque foreign
body.
IMPRESSION: 1. No fracture or dislocation.  No radiopaque foreign body.
2. Soft tissue injuries to the finger tips of the index middle and
ring fingers.

## 2023-05-16 ENCOUNTER — Other Ambulatory Visit: Payer: Self-pay

## 2023-05-16 ENCOUNTER — Emergency Department (HOSPITAL_COMMUNITY): Payer: Self-pay

## 2023-05-16 ENCOUNTER — Emergency Department (HOSPITAL_COMMUNITY)
Admission: EM | Admit: 2023-05-16 | Discharge: 2023-05-16 | Disposition: A | Discharge status: Home / Self Care | Payer: Self-pay | Attending: Emergency Medicine | Admitting: Emergency Medicine

## 2023-05-16 DIAGNOSIS — M25562 Pain in left knee: Secondary | ICD-10-CM | POA: Insufficient documentation

## 2023-05-16 DIAGNOSIS — X501XXA Overexertion from prolonged static or awkward postures, initial encounter: Secondary | ICD-10-CM | POA: Insufficient documentation

## 2023-05-16 DIAGNOSIS — Y9389 Activity, other specified: Secondary | ICD-10-CM | POA: Insufficient documentation

## 2023-05-16 NOTE — ED Notes (Signed)
Pt provided discharge instructions and prescription information. Pt was given the opportunity to ask questions and questions were answered.   

## 2023-05-16 NOTE — ED Provider Notes (Signed)
[facility] AT [facility] Provider Note   CSN: [id number] Arrival date & time: 05/16/23  2052     History {Add pertinent medical, surgical, social history, OB history to HPI:1} Chief Complaint  Patient presents with   Knee Pain    Kurt Clements is a 32 y.o. male.  He is here for evaluation of left knee pain.  He said he was horse playing a couple days ago when he pivoted twisted his knee and heard a pop and had acute pain in his knee.  Since then it has been swollen and painful and feels a little bit more unstable.  No numbness or weakness.  He relates he had a prior knee injury years ago with possibly torn ligaments that was never repaired.  He does not have an orthopedic doctor.  No other injuries or complaints.  Blood pressure elevated, he has been told that in the past.  He is not on any medications for it.  The history is provided by the patient.  Knee Pain Location:  Knee Time since incident:  2 days Injury: yes   Mechanism of injury comment:  Twisted Knee location:  L knee Pain details:    Quality:  Throbbing   Severity:  Moderate   Onset quality:  Sudden Chronicity:  Recurrent Associated symptoms: stiffness and swelling   Associated symptoms: no decreased ROM, no fever and no muscle weakness        Home Medications Prior to Admission medications   Medication Sig Start Date End Date Taking? Authorizing Provider  HYDROcodone-acetaminophen (NORCO/VICODIN) 5-325 MG per tablet Take 1-2 tablets by mouth every 6 (six) hours as needed. 08/31/14   Roxy Horseman, PA-C  ibuprofen (ADVIL,MOTRIN) 800 MG tablet Take 1 tablet (800 mg total) by mouth 3 (three) times daily. 03/19/15   Mady Gemma, PA-C  trimethoprim-polymyxin b (POLYTRIM) ophthalmic solution Place 1 drop into the left eye every 4 (four) hours. 03/19/15   Mady Gemma, PA-C      Allergies    Patient has no known allergies.    Review of Systems   Review of  Systems  Constitutional:  Negative for fever.  Musculoskeletal:  Positive for stiffness.    Physical Exam Updated Vital Signs BP (!) 192/94 (BP Location: Left Arm)   Pulse 69   Temp 98.6 F (37 C) (Oral)   Resp 19   Ht 6\' 7"  (2.007 m)   Wt 129.3 kg   SpO2 98%   BMI 32.11 kg/m  Physical Exam Vitals and nursing note reviewed.  Constitutional:      Appearance: Normal appearance. He is well-developed.  HENT:     Head: Normocephalic and atraumatic.  Eyes:     Conjunctiva/sclera: Conjunctivae normal.  Pulmonary:     Effort: Pulmonary effort is normal.  Musculoskeletal:        General: Swelling and tenderness present. No deformity. Normal range of motion.     Cervical back: Neck supple.     Comments: Left knee -he has no significant tenderness at the joint lines.  His extensor mechanism is intact.  He has a little bit of laxity in anterior and posterior drawer.  No pain with varus and valgus stress.  Distal pulses motor and sensation intact.  No open wounds.  Skin:    General: Skin is warm and dry.  Neurological:     General: No focal deficit present.     Mental Status: He is alert.  GCS: GCS eye subscore is 4. GCS verbal subscore is 5. GCS motor subscore is 6.     Sensory: No sensory deficit.     Motor: No weakness.     ED Results / Procedures / Treatments   Labs (all labs ordered are listed, but only abnormal results are displayed) Labs Reviewed - No data to display  EKG None  Radiology No results found.  Procedures Procedures  {Document cardiac monitor, telemetry assessment procedure when appropriate:1}  Medications Ordered in ED Medications - No data to display  ED Course/ Medical Decision Making/ A&P   {   Click here for ABCD2, HEART and other calculatorsREFRESH Note before signing :1}                              Medical Decision Making Amount and/or Complexity of Data Reviewed Radiology: ordered.   This patient complains of ***; this involves  an extensive number of treatment Options and is a complaint that carries with it a high risk of complications and morbidity. The differential includes ***  I ordered, reviewed and interpreted labs, which included *** I ordered medication *** and reviewed PMP when indicated. I ordered imaging studies which included *** and I independently    visualized and interpreted imaging which showed *** Additional history obtained from *** Previous records obtained and reviewed *** I consulted *** and discussed lab and imaging findings and discussed disposition.  Cardiac monitoring reviewed, *** Social determinants considered, *** Critical Interventions: ***  After the interventions stated above, I reevaluated the patient and found *** Admission and further testing considered, ***   {Document critical care time when appropriate:1} {Document review of labs and clinical decision tools ie heart score, Chads2Vasc2 etc:1}  {Document your independent review of radiology images, and any outside records:1} {Document your discussion with family members, caretakers, and with consultants:1} {Document social determinants of health affecting pt's care:1} {Document your decision making why or why not admission, treatments were needed:1} Final Clinical Impression(s) / ED Diagnoses Final diagnoses:  None    Rx / DC Orders ED Discharge Orders     None

## 2023-05-16 NOTE — Discharge Instructions (Signed)
You were seen in the emergency department for acute left knee pain after twisting it.  Your x-ray did not show any obvious fracture or dislocation.  There is possibly ligament or cartilage damage that does not trauma on x-ray.  You can use the knee immobilizer to help support the area and take Tylenol and ibuprofen for pain.  Ice the area until the swelling is improved.  Follow-up with orthopedics.

## 2023-05-16 NOTE — ED Triage Notes (Signed)
Pt reports moving around and wrestling when he twisted his left knee and heard a pop. Pt reports swelling and pain but is able to walk on leg. Pt has hx of torn ligament to left knee. Pt HTN in triage without hx of same.
